# Patient Record
Sex: Male | Born: 1950 | Race: White | Hispanic: No | Marital: Single | Smoking: Never smoker
Health system: Southern US, Community
[De-identification: ages and names within clinical notes are randomized; demographics above are authoritative.]

## PROBLEM LIST (undated history)

## (undated) HISTORY — PX: STENT PLACEMENT VASCULAR (ARMC HX): HXRAD1737

---

## 1999-06-08 ENCOUNTER — Encounter: Payer: Self-pay | Admitting: Family Medicine

## 1999-06-08 ENCOUNTER — Ambulatory Visit (HOSPITAL_COMMUNITY): Admission: RE | Admit: 1999-06-08 | Discharge: 1999-06-08 | Payer: Self-pay | Admitting: Family Medicine

## 1999-10-15 ENCOUNTER — Encounter: Payer: Self-pay | Admitting: Family Medicine

## 1999-10-15 ENCOUNTER — Ambulatory Visit (HOSPITAL_COMMUNITY): Admission: RE | Admit: 1999-10-15 | Discharge: 1999-10-15 | Payer: Self-pay | Admitting: Internal Medicine

## 1999-10-21 ENCOUNTER — Ambulatory Visit (HOSPITAL_COMMUNITY): Admission: RE | Admit: 1999-10-21 | Discharge: 1999-10-21 | Payer: Self-pay | Admitting: Family Medicine

## 2000-11-02 ENCOUNTER — Ambulatory Visit (HOSPITAL_COMMUNITY): Admission: RE | Admit: 2000-11-02 | Discharge: 2000-11-02 | Payer: Self-pay | Admitting: Family Medicine

## 2000-11-02 ENCOUNTER — Encounter: Payer: Self-pay | Admitting: Family Medicine

## 2001-03-01 ENCOUNTER — Ambulatory Visit (HOSPITAL_COMMUNITY): Admission: RE | Admit: 2001-03-01 | Discharge: 2001-03-01 | Payer: Self-pay | Admitting: Family Medicine

## 2001-03-01 ENCOUNTER — Encounter: Payer: Self-pay | Admitting: Family Medicine

## 2002-09-25 ENCOUNTER — Encounter (INDEPENDENT_AMBULATORY_CARE_PROVIDER_SITE_OTHER): Payer: Self-pay | Admitting: *Deleted

## 2002-09-25 ENCOUNTER — Ambulatory Visit (HOSPITAL_COMMUNITY): Admission: RE | Admit: 2002-09-25 | Discharge: 2002-09-25 | Payer: Self-pay | Admitting: Gastroenterology

## 2003-04-28 ENCOUNTER — Ambulatory Visit (HOSPITAL_COMMUNITY): Admission: RE | Admit: 2003-04-28 | Discharge: 2003-04-28 | Payer: Self-pay | Admitting: Family Medicine

## 2009-03-03 ENCOUNTER — Encounter: Admission: RE | Admit: 2009-03-03 | Discharge: 2009-03-03 | Payer: Self-pay | Admitting: Family Medicine

## 2010-05-28 NOTE — Op Note (Signed)
   NAME:  Adrian Greer, Adrian Greer                         ACCOUNT NO.:  [id number]   MEDICAL RECORD NO.:  [id number]                   PATIENT TYPE:  AMB   LOCATION:  ENDO                                 FACILITY:  [facility]   PHYSICIAN:  Graylin Shiver, M.D.                DATE OF BIRTH:  1950/12/26   DATE OF PROCEDURE:  09/25/2002  DATE OF DISCHARGE:                                 OPERATIVE REPORT   PROCEDURE:  Colonoscopy with biopsy.   INDICATION FOR PROCEDURE:  Screening.   Informed consent was obtained after explanation of the risks of bleeding,  infection, and perforation.   PREMEDICATION:  Fentanyl 60 mcg IV, Versed 6 mg IV.   DESCRIPTION OF PROCEDURE:  With the patient in the left lateral decubitus  position, a rectal exam was performed and no masses were felt.  The Olympus  colonoscope was inserted into the rectum and advanced around the colon to  the cecum.  Cecal landmarks were identified.  The cecum and ascending colon  were normal, the transverse colon normal, the descending colon was normal.  In the sigmoid there was a small 3 mm sessile polyp removed with cold  forceps.  The rectum was normal.  He tolerated the procedure well without  complications.   IMPRESSION:  Small sigmoid polyp.   PLAN:  The pathology will be checked.                                               Graylin Shiver, M.D.    SFG/MEDQ  D:  09/25/2002  T:  09/25/2002  Job:  [id number]   cc:   Meredith Staggers, M.D.  [address], [address]  [place]  [place] [address]  Fax: [phone]

## 2012-10-26 ENCOUNTER — Other Ambulatory Visit: Payer: Self-pay | Admitting: Orthopedic Surgery

## 2012-10-26 DIAGNOSIS — M545 Low back pain, unspecified: Secondary | ICD-10-CM

## 2012-10-31 ENCOUNTER — Ambulatory Visit
Admission: RE | Admit: 2012-10-31 | Discharge: 2012-10-31 | Disposition: A | Discharge status: Home / Self Care | Payer: No Typology Code available for payment source | Source: Ambulatory Visit | Attending: Orthopedic Surgery | Admitting: Orthopedic Surgery

## 2012-10-31 DIAGNOSIS — M545 Low back pain, unspecified: Secondary | ICD-10-CM

## 2014-03-07 DIAGNOSIS — F329 Major depressive disorder, single episode, unspecified: Secondary | ICD-10-CM | POA: Diagnosis not present

## 2014-03-07 DIAGNOSIS — E785 Hyperlipidemia, unspecified: Secondary | ICD-10-CM | POA: Diagnosis not present

## 2014-03-07 DIAGNOSIS — Z125 Encounter for screening for malignant neoplasm of prostate: Secondary | ICD-10-CM | POA: Diagnosis not present

## 2014-03-07 DIAGNOSIS — Z1159 Encounter for screening for other viral diseases: Secondary | ICD-10-CM | POA: Diagnosis not present

## 2014-03-07 DIAGNOSIS — I2510 Atherosclerotic heart disease of native coronary artery without angina pectoris: Secondary | ICD-10-CM | POA: Diagnosis not present

## 2014-03-07 DIAGNOSIS — I10 Essential (primary) hypertension: Secondary | ICD-10-CM | POA: Diagnosis not present

## 2014-03-07 DIAGNOSIS — Z0000 Encounter for general adult medical examination without abnormal findings: Secondary | ICD-10-CM | POA: Diagnosis not present

## 2014-03-07 DIAGNOSIS — Z23 Encounter for immunization: Secondary | ICD-10-CM | POA: Diagnosis not present

## 2014-03-10 ENCOUNTER — Other Ambulatory Visit: Payer: Self-pay | Admitting: Family Medicine

## 2014-03-10 DIAGNOSIS — Z136 Encounter for screening for cardiovascular disorders: Secondary | ICD-10-CM

## 2014-03-17 ENCOUNTER — Ambulatory Visit
Admission: RE | Admit: 2014-03-17 | Discharge: 2014-03-17 | Disposition: A | Discharge status: Home / Self Care | Payer: Commercial Managed Care - HMO | Source: Ambulatory Visit | Attending: Family Medicine | Admitting: Family Medicine

## 2014-03-17 DIAGNOSIS — Z136 Encounter for screening for cardiovascular disorders: Secondary | ICD-10-CM

## 2014-03-17 DIAGNOSIS — I10 Essential (primary) hypertension: Secondary | ICD-10-CM | POA: Diagnosis not present

## 2014-03-17 DIAGNOSIS — F17200 Nicotine dependence, unspecified, uncomplicated: Secondary | ICD-10-CM | POA: Diagnosis not present

## 2014-09-18 DIAGNOSIS — H5210 Myopia, unspecified eye: Secondary | ICD-10-CM | POA: Diagnosis not present

## 2014-09-18 DIAGNOSIS — I10 Essential (primary) hypertension: Secondary | ICD-10-CM | POA: Diagnosis not present

## 2014-09-18 DIAGNOSIS — H52229 Regular astigmatism, unspecified eye: Secondary | ICD-10-CM | POA: Diagnosis not present

## 2014-09-18 DIAGNOSIS — H25099 Other age-related incipient cataract, unspecified eye: Secondary | ICD-10-CM | POA: Diagnosis not present

## 2014-09-18 DIAGNOSIS — Z0100 Encounter for examination of eyes and vision without abnormal findings: Secondary | ICD-10-CM | POA: Diagnosis not present

## 2015-03-10 DIAGNOSIS — Z125 Encounter for screening for malignant neoplasm of prostate: Secondary | ICD-10-CM | POA: Diagnosis not present

## 2015-03-10 DIAGNOSIS — Z0000 Encounter for general adult medical examination without abnormal findings: Secondary | ICD-10-CM | POA: Diagnosis not present

## 2015-03-10 DIAGNOSIS — Z23 Encounter for immunization: Secondary | ICD-10-CM | POA: Diagnosis not present

## 2015-03-10 DIAGNOSIS — I10 Essential (primary) hypertension: Secondary | ICD-10-CM | POA: Diagnosis not present

## 2015-03-10 DIAGNOSIS — E785 Hyperlipidemia, unspecified: Secondary | ICD-10-CM | POA: Diagnosis not present

## 2015-03-10 DIAGNOSIS — I2510 Atherosclerotic heart disease of native coronary artery without angina pectoris: Secondary | ICD-10-CM | POA: Diagnosis not present

## 2015-03-10 DIAGNOSIS — K635 Polyp of colon: Secondary | ICD-10-CM | POA: Diagnosis not present

## 2015-03-24 DIAGNOSIS — R739 Hyperglycemia, unspecified: Secondary | ICD-10-CM | POA: Diagnosis not present

## 2015-04-01 DIAGNOSIS — E785 Hyperlipidemia, unspecified: Secondary | ICD-10-CM | POA: Diagnosis not present

## 2015-04-01 DIAGNOSIS — I2510 Atherosclerotic heart disease of native coronary artery without angina pectoris: Secondary | ICD-10-CM | POA: Diagnosis not present

## 2015-04-01 DIAGNOSIS — I10 Essential (primary) hypertension: Secondary | ICD-10-CM | POA: Diagnosis not present

## 2015-04-01 DIAGNOSIS — E668 Other obesity: Secondary | ICD-10-CM | POA: Diagnosis not present

## 2015-04-01 DIAGNOSIS — Z955 Presence of coronary angioplasty implant and graft: Secondary | ICD-10-CM | POA: Diagnosis not present

## 2015-04-06 DIAGNOSIS — M5412 Radiculopathy, cervical region: Secondary | ICD-10-CM | POA: Diagnosis not present

## 2015-04-06 DIAGNOSIS — M25512 Pain in left shoulder: Secondary | ICD-10-CM | POA: Diagnosis not present

## 2015-04-06 DIAGNOSIS — M542 Cervicalgia: Secondary | ICD-10-CM | POA: Diagnosis not present

## 2015-04-06 DIAGNOSIS — M5030 Other cervical disc degeneration, unspecified cervical region: Secondary | ICD-10-CM | POA: Diagnosis not present

## 2015-05-28 DIAGNOSIS — I2510 Atherosclerotic heart disease of native coronary artery without angina pectoris: Secondary | ICD-10-CM | POA: Diagnosis not present

## 2015-06-25 ENCOUNTER — Ambulatory Visit: Payer: Commercial Managed Care - HMO | Admitting: Family Medicine

## 2015-09-07 DIAGNOSIS — I10 Essential (primary) hypertension: Secondary | ICD-10-CM | POA: Diagnosis not present

## 2015-09-07 DIAGNOSIS — N401 Enlarged prostate with lower urinary tract symptoms: Secondary | ICD-10-CM | POA: Diagnosis not present

## 2015-09-07 DIAGNOSIS — Z23 Encounter for immunization: Secondary | ICD-10-CM | POA: Diagnosis not present

## 2015-09-07 DIAGNOSIS — R350 Frequency of micturition: Secondary | ICD-10-CM | POA: Diagnosis not present

## 2015-09-07 DIAGNOSIS — R7309 Other abnormal glucose: Secondary | ICD-10-CM | POA: Diagnosis not present

## 2015-09-07 DIAGNOSIS — E785 Hyperlipidemia, unspecified: Secondary | ICD-10-CM | POA: Diagnosis not present

## 2015-09-29 ENCOUNTER — Encounter (HOSPITAL_COMMUNITY): Payer: Self-pay

## 2015-09-29 ENCOUNTER — Emergency Department (HOSPITAL_COMMUNITY): Payer: Commercial Managed Care - HMO

## 2015-09-29 ENCOUNTER — Emergency Department (HOSPITAL_COMMUNITY)
Admission: EM | Admit: 2015-09-29 | Discharge: 2015-09-29 | Disposition: A | Discharge status: Home / Self Care | Payer: Commercial Managed Care - HMO | Attending: Emergency Medicine | Admitting: Emergency Medicine

## 2015-09-29 DIAGNOSIS — Y929 Unspecified place or not applicable: Secondary | ICD-10-CM | POA: Diagnosis not present

## 2015-09-29 DIAGNOSIS — Y9301 Activity, walking, marching and hiking: Secondary | ICD-10-CM | POA: Insufficient documentation

## 2015-09-29 DIAGNOSIS — S0191XA Laceration without foreign body of unspecified part of head, initial encounter: Secondary | ICD-10-CM

## 2015-09-29 DIAGNOSIS — S0101XA Laceration without foreign body of scalp, initial encounter: Secondary | ICD-10-CM | POA: Insufficient documentation

## 2015-09-29 DIAGNOSIS — R079 Chest pain, unspecified: Secondary | ICD-10-CM | POA: Diagnosis not present

## 2015-09-29 DIAGNOSIS — R0789 Other chest pain: Secondary | ICD-10-CM | POA: Diagnosis not present

## 2015-09-29 DIAGNOSIS — S0990XA Unspecified injury of head, initial encounter: Secondary | ICD-10-CM | POA: Diagnosis present

## 2015-09-29 DIAGNOSIS — S0510XA Contusion of eyeball and orbital tissues, unspecified eye, initial encounter: Secondary | ICD-10-CM | POA: Diagnosis not present

## 2015-09-29 DIAGNOSIS — Z7982 Long term (current) use of aspirin: Secondary | ICD-10-CM | POA: Insufficient documentation

## 2015-09-29 DIAGNOSIS — Y999 Unspecified external cause status: Secondary | ICD-10-CM | POA: Diagnosis not present

## 2015-09-29 DIAGNOSIS — W1830XA Fall on same level, unspecified, initial encounter: Secondary | ICD-10-CM | POA: Insufficient documentation

## 2015-09-29 DIAGNOSIS — R0602 Shortness of breath: Secondary | ICD-10-CM | POA: Diagnosis not present

## 2015-09-29 LAB — COMPREHENSIVE METABOLIC PANEL
ALT: 17 U/L (ref 17–63)
ANION GAP: 8 (ref 5–15)
AST: 26 U/L (ref 15–41)
Albumin: 3.8 g/dL (ref 3.5–5.0)
Alkaline Phosphatase: 64 U/L (ref 38–126)
BILIRUBIN TOTAL: 1.6 mg/dL — AB (ref 0.3–1.2)
BUN: 20 mg/dL (ref 6–20)
CHLORIDE: 108 mmol/L (ref 101–111)
CO2: 22 mmol/L (ref 22–32)
Calcium: 9.4 mg/dL (ref 8.9–10.3)
Creatinine, Ser: 1.06 mg/dL (ref 0.61–1.24)
GFR calc Af Amer: >60 mL/min (ref >60)
GFR calc non Af Amer: >60 mL/min (ref >60)
GLUCOSE: 134 mg/dL — AB (ref 65–99)
POTASSIUM: 4.5 mmol/L (ref 3.5–5.1)
SODIUM: 138 mmol/L (ref 135–145)
TOTAL PROTEIN: 6.1 g/dL — AB (ref 6.5–8.1)

## 2015-09-29 LAB — CBC WITH DIFFERENTIAL/PLATELET
Basophils Absolute: 0 10*3/uL (ref 0.0–0.1)
Basophils Relative: 0 %
EOS ABS: 0 10*3/uL (ref 0.0–0.7)
EOS PCT: 0 %
HCT: 45.4 % (ref 39.0–52.0)
Hemoglobin: 15.1 g/dL (ref 13.0–17.0)
Lymphocytes Relative: 24 %
Lymphs Abs: 1.9 10*3/uL (ref 0.7–4.0)
MCH: 32.1 pg (ref 26.0–34.0)
MCHC: 33.3 g/dL (ref 30.0–36.0)
MCV: 96.6 fL (ref 78.0–100.0)
MONO ABS: 0.6 10*3/uL (ref 0.1–1.0)
MONOS PCT: 7 %
Neutro Abs: 5.5 10*3/uL (ref 1.7–7.7)
Neutrophils Relative %: 69 %
PLATELETS: 163 10*3/uL (ref 150–400)
RBC: 4.7 MIL/uL (ref 4.22–5.81)
RDW: 12.5 % (ref 11.5–15.5)
WBC: 7.9 10*3/uL (ref 4.0–10.5)

## 2015-09-29 MED ORDER — TETANUS-DIPHTH-ACELL PERTUSSIS 5-2.5-18.5 LF-MCG/0.5 IM SUSP
0.5000 mL | Freq: Once | INTRAMUSCULAR | Status: AC
  Administered 2015-09-29: 0.5 mL via INTRAMUSCULAR
  Filled 2015-09-29: qty 0.5

## 2015-09-29 MED ORDER — LIDOCAINE HCL 2 % IJ SOLN
10.0000 mL | Freq: Once | INTRAMUSCULAR | Status: AC
  Administered 2015-09-29: 200 mg via INTRADERMAL
  Filled 2015-09-29: qty 20

## 2015-09-29 MED ORDER — SODIUM CHLORIDE 0.9 % IV BOLUS (SEPSIS)
1000.0000 mL | Freq: Once | INTRAVENOUS | Status: AC
  Administered 2015-09-29: 1000 mL via INTRAVENOUS

## 2015-09-29 NOTE — ED Notes (Signed)
Suture cart at bedside 

## 2015-09-29 NOTE — ED Provider Notes (Signed)
[facility] Provider Note   CSN: [id number] Arrival date & time: 09/29/15  1429     History   Chief Complaint Chief Complaint  Patient presents with  . Fall  . Dizziness    HPI Adrian Greer is a 65 y.o. male.  HPI Patient is a 65 year old male with no pertinent past medical history comes in today after suffering a fall.  Patient states he spent 1 hour on his treadmill and then went and sat to watch some television.  Patient states he normally drinks significant amount of water following his daily workout but today he neglected to do so.  Patient stood up suddenly to leave the room and began walking at which point he became dizzy.  Patient states he then continued to walk despite being bit dizzy and states "the next thing I knew I was falling."  Patient complains of left-sided chest wall pain as well as head pain.  Patient denies similar events, shortness of breath diaphoresis abdominal pain or any other symptoms. History reviewed. No pertinent past medical history.  There are no active problems to display for this patient.   Past Surgical History:  Procedure Laterality Date  . STENT PLACEMENT VASCULAR ([facility] HX)         Home Medications    Prior to Admission medications   Medication Sig Start Date End Date Taking? Authorizing Provider  aspirin EC 81 MG tablet Take 81 mg by mouth daily.   Yes Historical Provider, MD  lisinopril (PRINIVIL,ZESTRIL) 40 MG tablet Take 40 mg by mouth daily.   Yes Historical Provider, MD  simvastatin (ZOCOR) 80 MG tablet Take 80 mg by mouth daily.   Yes Historical Provider, MD  tamsulosin (FLOMAX) 0.4 MG CAPS capsule Take 0.4 mg by mouth daily.   Yes Historical Provider, MD    Family History No family history on file.  Social History Social History  Substance Use Topics  . Smoking status: Never Smoker  . Smokeless tobacco: Never Used  . Alcohol use No     Allergies   Review of patient's allergies indicates no known  allergies.   Review of Systems Review of Systems  Constitutional: Negative for chills, fatigue and fever.  Respiratory: Negative for chest tightness and shortness of breath.   Cardiovascular: Positive for chest pain. Negative for palpitations and leg swelling.  Gastrointestinal: Negative for abdominal pain.  Neurological: Positive for dizziness, syncope and light-headedness. Negative for tremors, speech difficulty, weakness and numbness.  All other systems reviewed and are negative.    Physical Exam Updated Vital Signs BP 106/73   Pulse (!) 57   Temp 98.2 F (36.8 C) (Oral)   Resp 21   Ht 5\' 6"  (1.676 m)   Wt 86.2 kg   SpO2 96%   BMI 30.67 kg/m   Physical Exam  Constitutional: He is oriented to person, place, and time. He appears well-developed and well-nourished.  HENT:  Head: Normocephalic.  As in MDM  Eyes: Conjunctivae are normal.  Neck: Neck supple.  Cardiovascular: Normal rate and regular rhythm.   No murmur heard. Pulmonary/Chest: Effort normal and breath sounds normal. No respiratory distress.  Abdominal: Soft. There is no tenderness.  Musculoskeletal: He exhibits no edema.  Neurological: He is alert and oriented to person, place, and time. No cranial nerve deficit. He exhibits normal muscle tone. Coordination normal.  Skin: Skin is warm and dry.  Psychiatric: He has a normal mood and affect.  Nursing note and vitals reviewed.  ED Treatments / Results  Labs (all labs ordered are listed, but only abnormal results are displayed) Labs Reviewed  COMPREHENSIVE METABOLIC PANEL - Abnormal; Notable for the following:       Result Value   Glucose, Bld 134 (*)    Total Protein 6.1 (*)    Total Bilirubin 1.6 (*)    All other components within normal limits  CBC WITH DIFFERENTIAL/PLATELET  URINALYSIS, ROUTINE W REFLEX MICROSCOPIC (NOT AT [facility])  CBG MONITORING, ED    EKG  EKG Interpretation  Date/Time:  Tuesday September 29 2015 14:58:57 EDT Ventricular  Rate:  75 PR Interval:  152 QRS Duration: 88 QT Interval:  368 QTC Calculation: 410 R Axis:   -55 Text Interpretation:  Sinus rhythm with occasional Premature ventricular complexes Left axis deviation Abnormal ECG Premature ventricular complexes no prior EKG for comparison  Confirmed by LIU MD, DANA [phone]) on 09/29/2015 6:10:30 PM       Radiology Dg Chest 2 View  Result Date: 09/29/2015 CLINICAL DATA:  Chest pain and shortness of Breath. Syncopal episode tonight. EXAM: CHEST  2 VIEW COMPARISON:  None. FINDINGS: The cardiac silhouette, mediastinal and hilar contours are within normal limits. The lungs are clear. No acute pulmonary findings. Artifact from EKG leads are noted. No pleural effusion. The bony structures are intact. IMPRESSION: No acute cardiopulmonary findings. Electronically Signed   By: Rudie Meyer M.D.   On: 09/29/2015 19:53   Ct Head Wo Contrast  Result Date: 09/29/2015 CLINICAL DATA:  65 year old male with syncope and fall after running on treadmill today. Laceration to left eyebrow. Initial encounter. EXAM: CT HEAD WITHOUT CONTRAST TECHNIQUE: Contiguous axial images were obtained from the base of the skull through the vertex without intravenous contrast. COMPARISON:  None. FINDINGS: Brain: No midline shift, ventriculomegaly, mass effect, evidence of mass lesion, intracranial hemorrhage or evidence of cortically based acute infarction. Gray-white matter differentiation is within normal limits throughout the brain. Vascular: Calcified atherosclerosis at the skull base. Skull: No acute osseous abnormality identified. Sinuses/Orbits: Partially visible bubbly opacity and material in the right maxillary sinus. Other visible paranasal sinuses, mastoids and tympanic cavities are clear. Other: Soft tissue hematoma in the preseptal space situated between the left orbit and nose. Superimposed laceration of the left forehead with trace subcutaneous gas (series 3, image 31). Left frontal bone  and visible left orbital walls remain intact. IMPRESSION: 1. Soft tissue laceration and hematoma about the left orbit. No underlying fracture is evident. 2. No acute traumatic injury to the brain. Normal for age non contrast CT appearance of the brain. Electronically Signed   By: Odessa Fleming M.D.   On: 09/29/2015 19:47    Procedures .Marland KitchenLaceration Repair Date/Time: 09/29/2015 9:06 PM Performed by: Ruthell Rummage, Giavonni Fonder Authorized by: Ruthell Rummage, Willona Phariss   Consent:    Consent obtained:  Verbal   Consent given by:  Patient   Risks discussed:  Infection, pain, poor wound healing, poor cosmetic result, need for additional repair and nerve damage   Alternatives discussed:  No treatment Anesthesia (see MAR for exact dosages):    Anesthesia method:  Local infiltration   Local anesthetic:  Lidocaine 2% w/o epi Laceration details:    Location:  Scalp   Scalp location:  Mid-scalp   Length (cm):  3 Repair type:    Repair type:  Simple Pre-procedure details:    Preparation:  Patient was prepped and draped in usual sterile fashion Exploration:    Hemostasis achieved with:  Direct pressure   Wound exploration: wound  explored through full range of motion and entire depth of wound probed and visualized     Contaminated: no   Treatment:    Area cleansed with:  Saline   Amount of cleaning:  Extensive   Irrigation solution:  Sterile saline   Irrigation volume:  500ml   Irrigation method:  Syringe   Visualized foreign bodies/material removed: no   Skin repair:    Repair method:  Sutures   Suture size:  5-0   Suture material:  Chromic gut   Suture technique:  Simple interrupted   Number of sutures:  4 Approximation:    Approximation:  Close   Vermilion border: well-aligned   Post-procedure details:    Dressing:  Open (no dressing)   Patient tolerance of procedure:  Tolerated well, no immediate complications .Marland Kitchen.Laceration Repair Date/Time: 09/29/2015 9:06 PM Performed by: Ruthell RummageLUCKEY, Clarabell Matsuoka Authorized by: Ruthell RummageLUCKEY, Philip Eckersley    Consent:    Consent obtained:  Verbal   Consent given by:  Patient   Risks discussed:  Infection, pain, poor cosmetic result, need for additional repair, nerve damage and poor wound healing   Alternatives discussed:  No treatment Anesthesia (see MAR for exact dosages):    Anesthesia method:  Local infiltration   Local anesthetic:  Lidocaine 2% w/o epi Laceration details:    Location:  Scalp   Scalp location:  Frontal   Length (cm):  6 Repair type:    Repair type:  Simple Pre-procedure details:    Preparation:  Patient was prepped and draped in usual sterile fashion Exploration:    Hemostasis achieved with:  Direct pressure   Wound exploration: wound explored through full range of motion and entire depth of wound probed and visualized     Contaminated: no   Treatment:    Area cleansed with:  Saline   Amount of cleaning:  Extensive   Irrigation solution:  Sterile saline   Irrigation volume:  500ml   Irrigation method:  Syringe   Visualized foreign bodies/material removed: no   Skin repair:    Repair method:  Sutures   Suture size:  5-0   Suture material:  Chromic gut   Suture technique:  Simple interrupted   Number of sutures:  9 Approximation:    Approximation:  Close   Vermilion border: well-aligned   Post-procedure details:    Dressing:  Open (no dressing)   Patient tolerance of procedure:  Tolerated well, no immediate complications   (including critical care time)  Medications Ordered in ED Medications  sodium chloride 0.9 % bolus 1,000 mL (0 mLs Intravenous Stopped 09/29/15 2109)  lidocaine (XYLOCAINE) 2 % (with pres) injection 200 mg (200 mg Intradermal Given 09/29/15 1949)  Tdap (BOOSTRIX) injection 0.5 mL (0.5 mLs Intramuscular Given 09/29/15 2105)     Initial Impression / Assessment and Plan / ED Course  I have reviewed the triage vital signs and the nursing notes.  Pertinent labs & imaging results that were available during my care of the patient were  reviewed by me and considered in my medical decision making (see chart for details).  Clinical Course    Patient is a 65 year old male with no pertinent past medical history comes in today after suffering a fall.  Patient states he spent 1 hour on his treadmill and then went and sat to watch some television.  Patient states he normally drinks significant amount of water following his daily workout but today he neglected to do so.  Patient stood up suddenly to leave the  room and began walking at which point he became dizzy.  Patient states he then continued to walk despite being bit dizzy and states "the next thing I knew I was falling."  Patient complains of left-sided chest wall pain as well as head pain.  Patient denies similar events, shortness of breath diaphoresis abdominal pain or any other symptoms.  Physical exam: Patient clear to auscultation bilaterally normal S1-S2 no rubs murmurs gallops abdomen soft nontender.  Cranial nerves II through XII intact.  Patient with a approximately 6 cm laceration to the left lateral scalp near the eyebrow.  There is an additional 3cm laceration slightly left of midline in the center of the forehead.  There is some associated bruising around the patient's left eye and bridge of the nose.  Remainder physical exam within normal limits.  We will hydrate patient with bolus of normal saline and will close laceration with suture. Will also update tetanus.  Head CT WNL, Sutures in place. Wounds hemostatic.  Pt given appropriate f/u and return precautions. Pt voiced understanding and is agreeable to discharge at this time.     Final Clinical Impressions(s) / ED Diagnoses   Final diagnoses:  Laceration of head, initial encounter    New Prescriptions Discharge Medication List as of 09/29/2015  8:55 PM       Caren Griffins, MD 09/29/15 2312    Lavera Guise, MD 09/29/15 2328

## 2015-09-29 NOTE — ED Triage Notes (Signed)
Per Pt, Pt walked on the treadmill this morning. After he got off, he sat down for about an hour. Pt reports "jumping up from a chair" and he started walking. Reports getting dizzy, LOC, and fell to the floor. Pt reports hitting ribs, head, and left knee. Pt is alert and oriented x4 upon arrival to the ED.

## 2015-09-29 NOTE — ED Notes (Signed)
Report given to jazmine rn. Pt is still in CT at this time.

## 2016-03-10 DIAGNOSIS — J0100 Acute maxillary sinusitis, unspecified: Secondary | ICD-10-CM | POA: Diagnosis not present

## 2016-03-10 DIAGNOSIS — I10 Essential (primary) hypertension: Secondary | ICD-10-CM | POA: Diagnosis not present

## 2016-03-10 DIAGNOSIS — I2510 Atherosclerotic heart disease of native coronary artery without angina pectoris: Secondary | ICD-10-CM | POA: Diagnosis not present

## 2016-03-10 DIAGNOSIS — Z1211 Encounter for screening for malignant neoplasm of colon: Secondary | ICD-10-CM | POA: Diagnosis not present

## 2016-03-10 DIAGNOSIS — Z23 Encounter for immunization: Secondary | ICD-10-CM | POA: Diagnosis not present

## 2016-03-10 DIAGNOSIS — Z125 Encounter for screening for malignant neoplasm of prostate: Secondary | ICD-10-CM | POA: Diagnosis not present

## 2016-03-10 DIAGNOSIS — E7800 Pure hypercholesterolemia, unspecified: Secondary | ICD-10-CM | POA: Diagnosis not present

## 2016-03-10 DIAGNOSIS — R7303 Prediabetes: Secondary | ICD-10-CM | POA: Diagnosis not present

## 2016-03-10 DIAGNOSIS — Z0000 Encounter for general adult medical examination without abnormal findings: Secondary | ICD-10-CM | POA: Diagnosis not present

## 2016-03-10 DIAGNOSIS — N401 Enlarged prostate with lower urinary tract symptoms: Secondary | ICD-10-CM | POA: Diagnosis not present

## 2016-04-21 DIAGNOSIS — L237 Allergic contact dermatitis due to plants, except food: Secondary | ICD-10-CM | POA: Diagnosis not present

## 2016-05-09 ENCOUNTER — Other Ambulatory Visit: Payer: Self-pay | Admitting: Family Medicine

## 2016-05-09 ENCOUNTER — Ambulatory Visit
Admission: RE | Admit: 2016-05-09 | Discharge: 2016-05-09 | Disposition: A | Discharge status: Home / Self Care | Payer: Commercial Managed Care - HMO | Source: Ambulatory Visit | Attending: Family Medicine | Admitting: Family Medicine

## 2016-05-09 DIAGNOSIS — M79671 Pain in right foot: Secondary | ICD-10-CM

## 2016-05-09 DIAGNOSIS — S99921A Unspecified injury of right foot, initial encounter: Secondary | ICD-10-CM | POA: Diagnosis not present

## 2016-05-09 DIAGNOSIS — G8929 Other chronic pain: Secondary | ICD-10-CM | POA: Diagnosis not present

## 2016-05-30 DIAGNOSIS — M79672 Pain in left foot: Secondary | ICD-10-CM | POA: Diagnosis not present

## 2016-05-30 DIAGNOSIS — S92514A Nondisplaced fracture of proximal phalanx of right lesser toe(s), initial encounter for closed fracture: Secondary | ICD-10-CM | POA: Diagnosis not present

## 2016-05-30 DIAGNOSIS — M25572 Pain in left ankle and joints of left foot: Secondary | ICD-10-CM | POA: Diagnosis not present

## 2016-05-30 DIAGNOSIS — M7751 Other enthesopathy of right foot: Secondary | ICD-10-CM | POA: Diagnosis not present

## 2016-05-30 DIAGNOSIS — M79671 Pain in right foot: Secondary | ICD-10-CM | POA: Diagnosis not present

## 2016-08-05 DIAGNOSIS — K5730 Diverticulosis of large intestine without perforation or abscess without bleeding: Secondary | ICD-10-CM | POA: Diagnosis not present

## 2016-08-05 DIAGNOSIS — Z86010 Personal history of colonic polyps: Secondary | ICD-10-CM | POA: Diagnosis not present

## 2016-09-14 DIAGNOSIS — I10 Essential (primary) hypertension: Secondary | ICD-10-CM | POA: Diagnosis not present

## 2016-09-14 DIAGNOSIS — E668 Other obesity: Secondary | ICD-10-CM | POA: Diagnosis not present

## 2016-09-14 DIAGNOSIS — E785 Hyperlipidemia, unspecified: Secondary | ICD-10-CM | POA: Diagnosis not present

## 2016-09-14 DIAGNOSIS — I2510 Atherosclerotic heart disease of native coronary artery without angina pectoris: Secondary | ICD-10-CM | POA: Diagnosis not present

## 2016-09-14 DIAGNOSIS — Z955 Presence of coronary angioplasty implant and graft: Secondary | ICD-10-CM | POA: Diagnosis not present

## 2017-02-08 DIAGNOSIS — F329 Major depressive disorder, single episode, unspecified: Secondary | ICD-10-CM | POA: Diagnosis not present

## 2017-02-08 DIAGNOSIS — Z6831 Body mass index (BMI) 31.0-31.9, adult: Secondary | ICD-10-CM | POA: Diagnosis not present

## 2017-02-08 DIAGNOSIS — E785 Hyperlipidemia, unspecified: Secondary | ICD-10-CM | POA: Diagnosis not present

## 2017-02-08 DIAGNOSIS — R7303 Prediabetes: Secondary | ICD-10-CM | POA: Diagnosis not present

## 2017-02-08 DIAGNOSIS — E669 Obesity, unspecified: Secondary | ICD-10-CM | POA: Diagnosis not present

## 2017-02-08 DIAGNOSIS — I10 Essential (primary) hypertension: Secondary | ICD-10-CM | POA: Diagnosis not present

## 2017-02-08 DIAGNOSIS — K30 Functional dyspepsia: Secondary | ICD-10-CM | POA: Diagnosis not present

## 2017-03-15 DIAGNOSIS — G4701 Insomnia due to medical condition: Secondary | ICD-10-CM | POA: Diagnosis not present

## 2017-03-15 DIAGNOSIS — F329 Major depressive disorder, single episode, unspecified: Secondary | ICD-10-CM | POA: Diagnosis not present

## 2017-03-27 IMAGING — CT CT HEAD W/O CM
4 series · 16 of 47 positions shown, 18 images · non-contrast
Comparison: None.

CLINICAL DATA: 64-year-old male with syncope and fall after running
on treadmill today. Laceration to left eyebrow. Initial encounter.

EXAM:
CT HEAD WITHOUT CONTRAST
TECHNIQUE: Contiguous axial images were obtained from the base of the skull
through the vertex without intravenous contrast.

[Series 2: head without · axial · non-contrast · 0.46mm/px · z∈[-124,-19]mm · 6 of 31 slices shown, 8 images]
[im 5/31  brain]
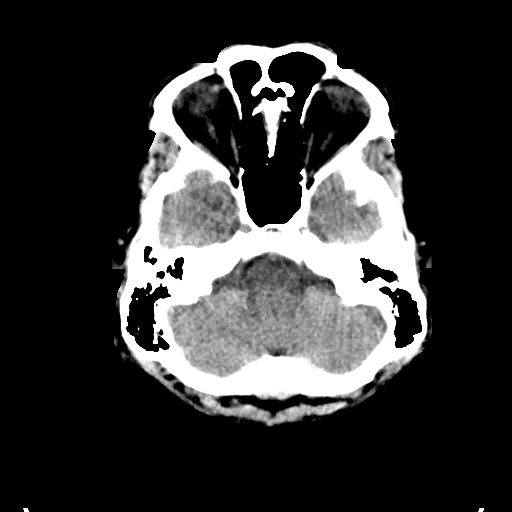
[im 5/31  bone]
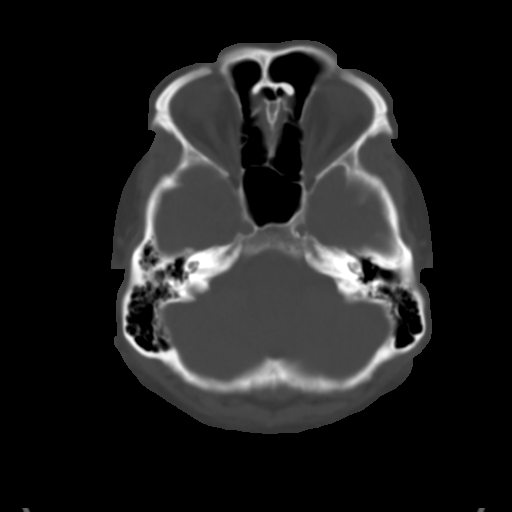
[im 9/31  brain]
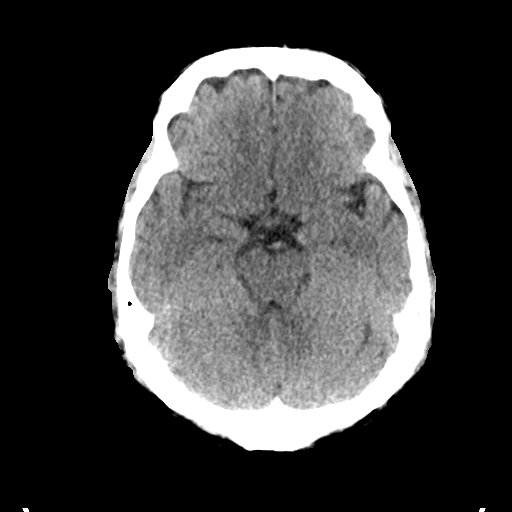
[im 13/31  brain]
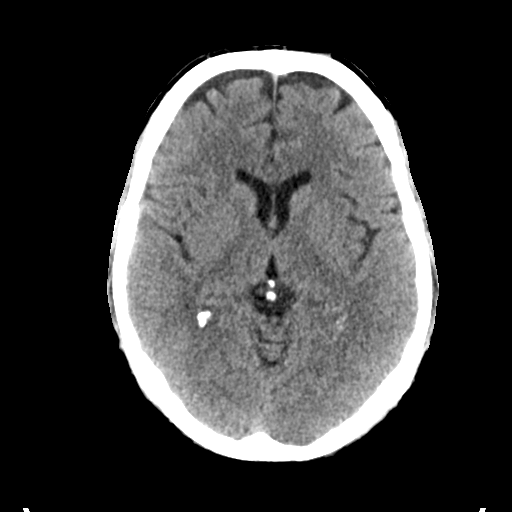
[im 18/31  brain]
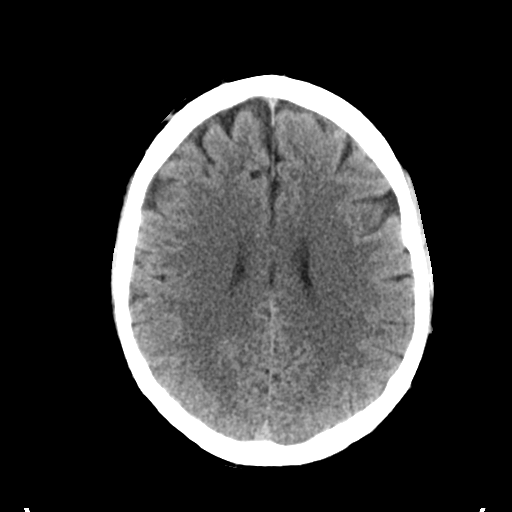
[im 22/31  brain]
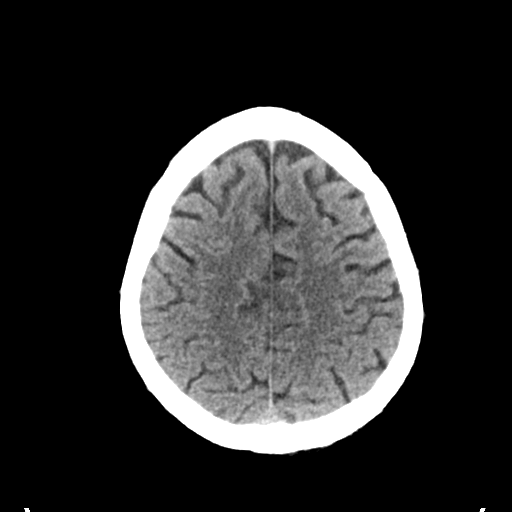
[im 22/31  bone]
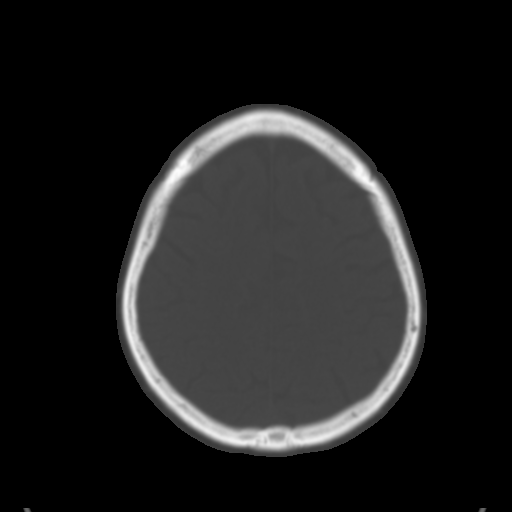
[im 26/31  brain]
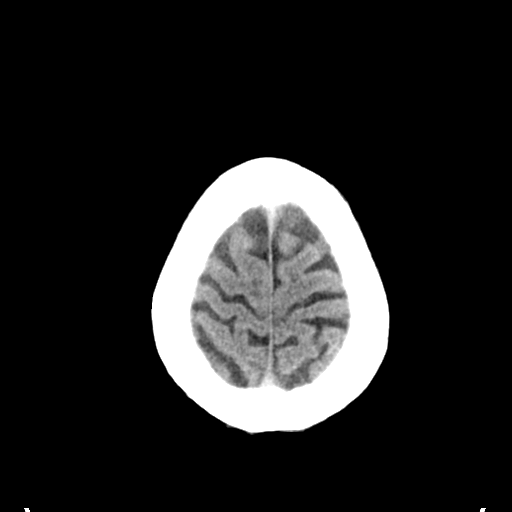

[Series 3: head bone · axial · 0.46mm/px · z∈[-130,-78]mm · 4 of 80 slices shown]
[im 8/80  bone]
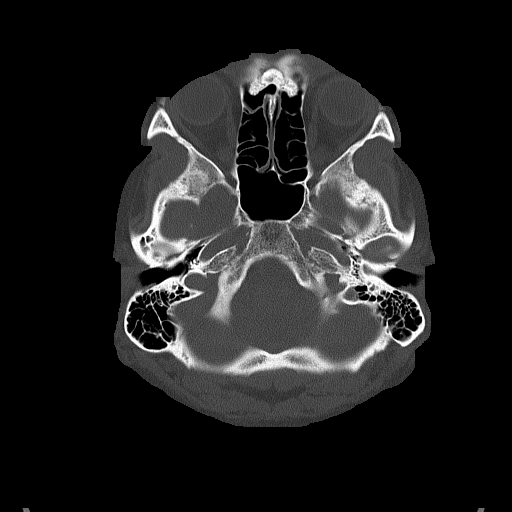
[im 16/80  bone]
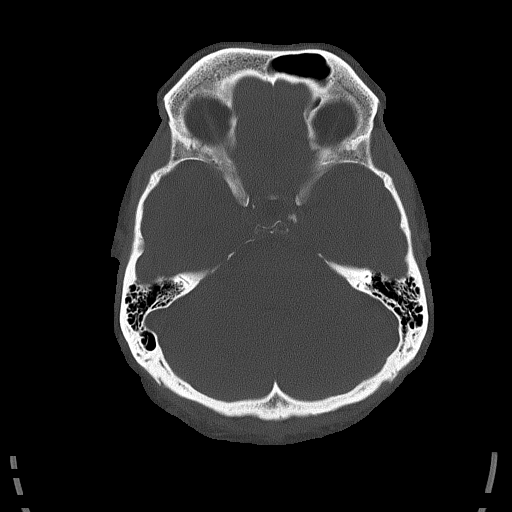
[im 27/80  bone]
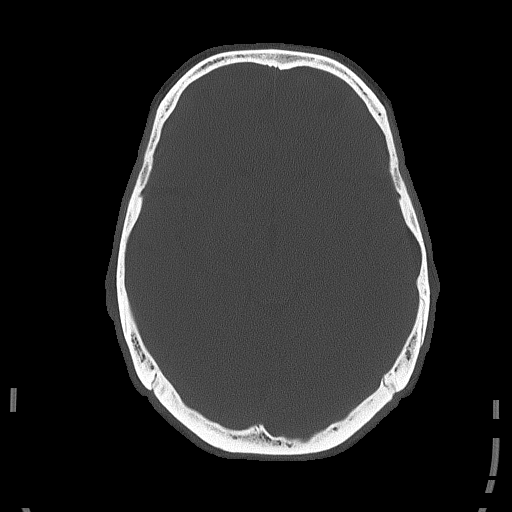
[im 34/80  bone]
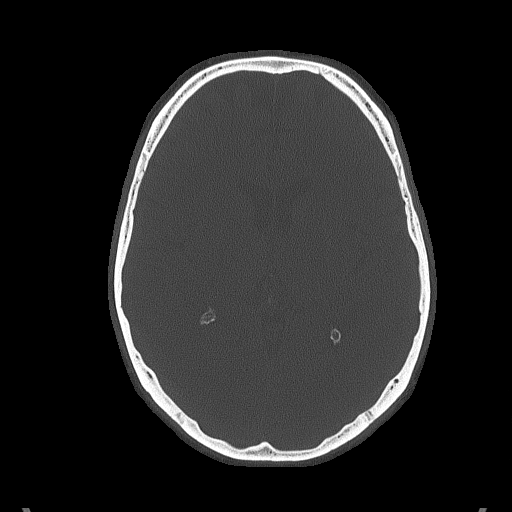

[Series 4: head without cor · coronal · non-contrast · 0.32mm/px · 3 of 71 slices shown]
[im 24/71  brain]
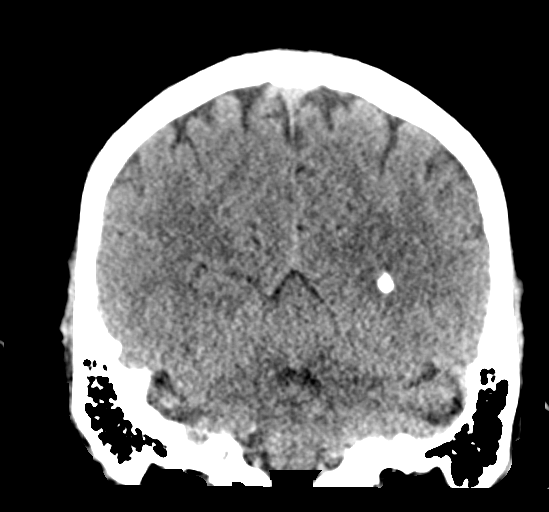
[im 32/71  brain]
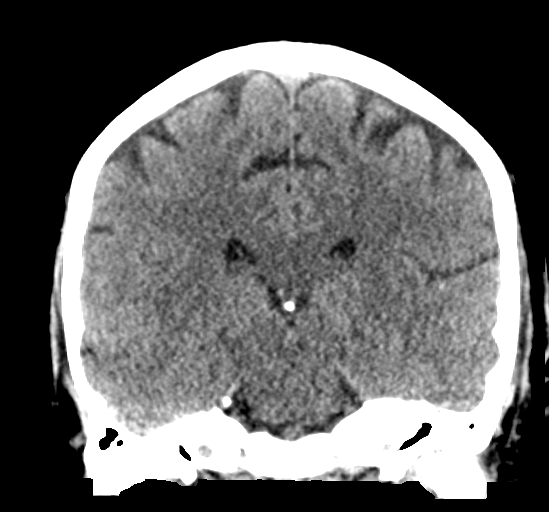
[im 39/71  brain]
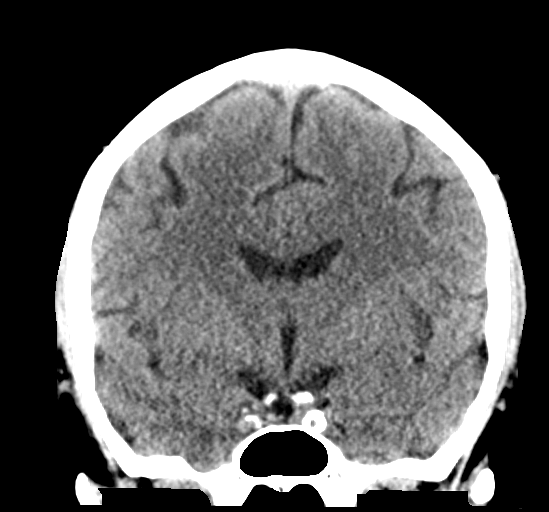

[Series 5: head without sag · sagittal · non-contrast · 0.31mm/px · 3 of 67 slices shown]
[im 23/67  brain]
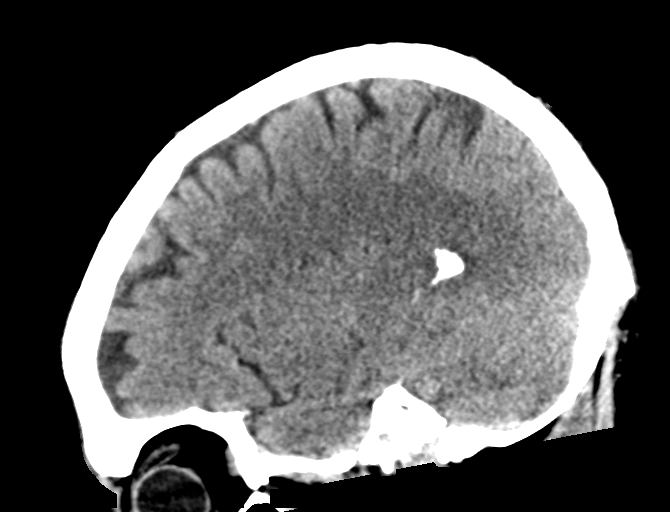
[im 34/67  brain]
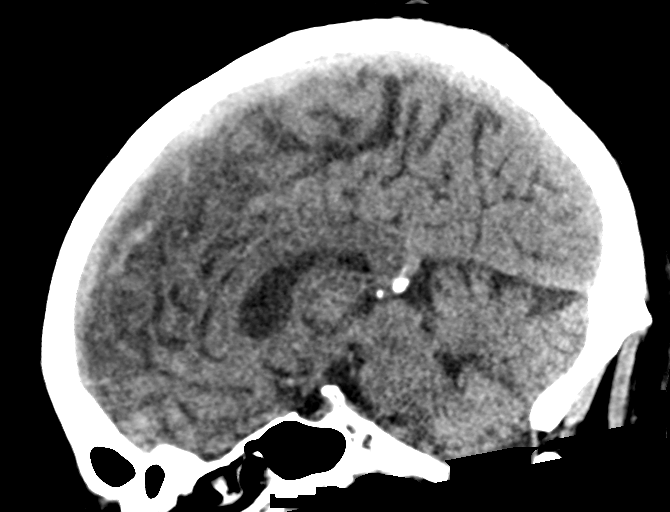
[im 45/67  brain]
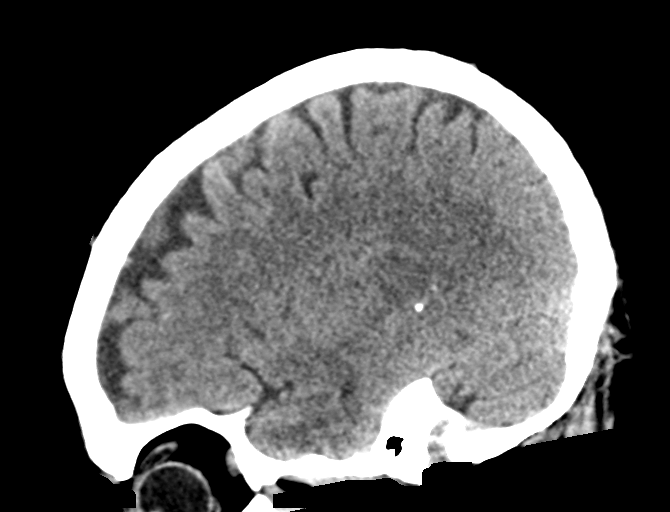

[16 of 47 positions shown; findings below may reference images not displayed]

FINDINGS: Brain: No midline shift, ventriculomegaly, mass effect, evidence of
mass lesion, intracranial hemorrhage or evidence of cortically based
acute infarction. Gray-white matter differentiation is within normal
limits throughout the brain.

Vascular: Calcified atherosclerosis at the skull base.

Skull: No acute osseous abnormality identified.

Sinuses/Orbits: Partially visible bubbly opacity and material in the
right maxillary sinus. Other visible paranasal sinuses, mastoids and
tympanic cavities are clear.

Other: Soft tissue hematoma in the preseptal space situated between
the left orbit and nose. Superimposed laceration of the left
forehead with trace subcutaneous gas (series 3, image 31). Left
frontal bone and visible left orbital walls remain intact.
IMPRESSION: 1. Soft tissue laceration and hematoma about the left orbit. No
underlying fracture is evident.
2. No acute traumatic injury to the brain. Normal for age non
contrast CT appearance of the brain.

## 2017-04-12 DIAGNOSIS — F320 Major depressive disorder, single episode, mild: Secondary | ICD-10-CM | POA: Diagnosis not present

## 2017-04-12 DIAGNOSIS — G4700 Insomnia, unspecified: Secondary | ICD-10-CM | POA: Diagnosis not present

## 2017-04-12 DIAGNOSIS — K30 Functional dyspepsia: Secondary | ICD-10-CM | POA: Diagnosis not present

## 2017-04-12 DIAGNOSIS — I10 Essential (primary) hypertension: Secondary | ICD-10-CM | POA: Diagnosis not present

## 2017-04-12 DIAGNOSIS — M1990 Unspecified osteoarthritis, unspecified site: Secondary | ICD-10-CM | POA: Diagnosis not present

## 2017-04-12 DIAGNOSIS — F1320 Sedative, hypnotic or anxiolytic dependence, uncomplicated: Secondary | ICD-10-CM | POA: Diagnosis not present

## 2017-04-12 DIAGNOSIS — F419 Anxiety disorder, unspecified: Secondary | ICD-10-CM | POA: Diagnosis not present

## 2017-04-12 DIAGNOSIS — E782 Mixed hyperlipidemia: Secondary | ICD-10-CM | POA: Diagnosis not present

## 2017-04-12 DIAGNOSIS — N400 Enlarged prostate without lower urinary tract symptoms: Secondary | ICD-10-CM | POA: Diagnosis not present

## 2017-04-12 DIAGNOSIS — Z86010 Personal history of colonic polyps: Secondary | ICD-10-CM | POA: Diagnosis not present

## 2017-04-24 DIAGNOSIS — R1084 Generalized abdominal pain: Secondary | ICD-10-CM | POA: Diagnosis not present

## 2017-04-24 DIAGNOSIS — I10 Essential (primary) hypertension: Secondary | ICD-10-CM | POA: Diagnosis not present

## 2017-05-08 DIAGNOSIS — I10 Essential (primary) hypertension: Secondary | ICD-10-CM | POA: Diagnosis not present

## 2017-05-08 DIAGNOSIS — E7800 Pure hypercholesterolemia, unspecified: Secondary | ICD-10-CM | POA: Diagnosis not present

## 2017-05-08 DIAGNOSIS — H5200 Hypermetropia, unspecified eye: Secondary | ICD-10-CM | POA: Diagnosis not present

## 2017-05-12 DIAGNOSIS — K30 Functional dyspepsia: Secondary | ICD-10-CM | POA: Diagnosis not present

## 2017-05-12 DIAGNOSIS — I10 Essential (primary) hypertension: Secondary | ICD-10-CM | POA: Diagnosis not present

## 2017-05-12 DIAGNOSIS — F419 Anxiety disorder, unspecified: Secondary | ICD-10-CM | POA: Diagnosis not present

## 2017-05-12 DIAGNOSIS — F320 Major depressive disorder, single episode, mild: Secondary | ICD-10-CM | POA: Diagnosis not present

## 2017-06-08 DIAGNOSIS — E782 Mixed hyperlipidemia: Secondary | ICD-10-CM | POA: Diagnosis not present

## 2017-06-08 DIAGNOSIS — F419 Anxiety disorder, unspecified: Secondary | ICD-10-CM | POA: Diagnosis not present

## 2017-06-08 DIAGNOSIS — F320 Major depressive disorder, single episode, mild: Secondary | ICD-10-CM | POA: Diagnosis not present

## 2017-06-08 DIAGNOSIS — Z6830 Body mass index (BMI) 30.0-30.9, adult: Secondary | ICD-10-CM | POA: Diagnosis not present

## 2017-06-08 DIAGNOSIS — Z0000 Encounter for general adult medical examination without abnormal findings: Secondary | ICD-10-CM | POA: Diagnosis not present

## 2017-06-08 DIAGNOSIS — Z13220 Encounter for screening for lipoid disorders: Secondary | ICD-10-CM | POA: Diagnosis not present

## 2017-06-08 DIAGNOSIS — I10 Essential (primary) hypertension: Secondary | ICD-10-CM | POA: Diagnosis not present

## 2017-07-10 DIAGNOSIS — M65312 Trigger thumb, left thumb: Secondary | ICD-10-CM | POA: Diagnosis not present

## 2017-07-10 DIAGNOSIS — G4700 Insomnia, unspecified: Secondary | ICD-10-CM | POA: Diagnosis not present

## 2017-07-10 DIAGNOSIS — I10 Essential (primary) hypertension: Secondary | ICD-10-CM | POA: Diagnosis not present

## 2017-07-10 DIAGNOSIS — F320 Major depressive disorder, single episode, mild: Secondary | ICD-10-CM | POA: Diagnosis not present

## 2017-09-20 DIAGNOSIS — L255 Unspecified contact dermatitis due to plants, except food: Secondary | ICD-10-CM | POA: Diagnosis not present

## 2017-09-20 DIAGNOSIS — I10 Essential (primary) hypertension: Secondary | ICD-10-CM | POA: Diagnosis not present

## 2017-09-25 DIAGNOSIS — E785 Hyperlipidemia, unspecified: Secondary | ICD-10-CM | POA: Diagnosis not present

## 2017-09-25 DIAGNOSIS — E668 Other obesity: Secondary | ICD-10-CM | POA: Diagnosis not present

## 2017-09-25 DIAGNOSIS — I2510 Atherosclerotic heart disease of native coronary artery without angina pectoris: Secondary | ICD-10-CM | POA: Diagnosis not present

## 2017-09-25 DIAGNOSIS — I10 Essential (primary) hypertension: Secondary | ICD-10-CM | POA: Diagnosis not present

## 2017-09-25 DIAGNOSIS — Z955 Presence of coronary angioplasty implant and graft: Secondary | ICD-10-CM | POA: Diagnosis not present

## 2017-10-16 DIAGNOSIS — I10 Essential (primary) hypertension: Secondary | ICD-10-CM | POA: Diagnosis not present

## 2017-10-16 DIAGNOSIS — F320 Major depressive disorder, single episode, mild: Secondary | ICD-10-CM | POA: Diagnosis not present

## 2017-10-16 DIAGNOSIS — Z23 Encounter for immunization: Secondary | ICD-10-CM | POA: Diagnosis not present

## 2017-10-16 DIAGNOSIS — E782 Mixed hyperlipidemia: Secondary | ICD-10-CM | POA: Diagnosis not present

## 2017-10-16 DIAGNOSIS — G4700 Insomnia, unspecified: Secondary | ICD-10-CM | POA: Diagnosis not present

## 2017-10-16 DIAGNOSIS — K219 Gastro-esophageal reflux disease without esophagitis: Secondary | ICD-10-CM | POA: Diagnosis not present

## 2017-12-04 DIAGNOSIS — M25511 Pain in right shoulder: Secondary | ICD-10-CM | POA: Diagnosis not present

## 2017-12-04 DIAGNOSIS — M25619 Stiffness of unspecified shoulder, not elsewhere classified: Secondary | ICD-10-CM | POA: Diagnosis not present

## 2017-12-04 DIAGNOSIS — I10 Essential (primary) hypertension: Secondary | ICD-10-CM | POA: Diagnosis not present

## 2017-12-28 DIAGNOSIS — I10 Essential (primary) hypertension: Secondary | ICD-10-CM | POA: Diagnosis not present

## 2017-12-28 DIAGNOSIS — M25511 Pain in right shoulder: Secondary | ICD-10-CM | POA: Diagnosis not present

## 2018-02-16 DIAGNOSIS — G4700 Insomnia, unspecified: Secondary | ICD-10-CM | POA: Diagnosis not present

## 2018-02-16 DIAGNOSIS — E782 Mixed hyperlipidemia: Secondary | ICD-10-CM | POA: Diagnosis not present

## 2018-02-16 DIAGNOSIS — K219 Gastro-esophageal reflux disease without esophagitis: Secondary | ICD-10-CM | POA: Diagnosis not present

## 2018-02-16 DIAGNOSIS — M25511 Pain in right shoulder: Secondary | ICD-10-CM | POA: Diagnosis not present

## 2018-02-16 DIAGNOSIS — R41840 Attention and concentration deficit: Secondary | ICD-10-CM | POA: Diagnosis not present

## 2018-02-16 DIAGNOSIS — I10 Essential (primary) hypertension: Secondary | ICD-10-CM | POA: Diagnosis not present

## 2018-02-16 DIAGNOSIS — F320 Major depressive disorder, single episode, mild: Secondary | ICD-10-CM | POA: Diagnosis not present

## 2018-02-20 DIAGNOSIS — M25511 Pain in right shoulder: Secondary | ICD-10-CM | POA: Diagnosis not present
